# Patient Record
Sex: Male | Born: 2005 | Race: Black or African American | Hispanic: No | Marital: Single | Smoking: Never smoker
Health system: Southern US, Community
[De-identification: ages and names within clinical notes are randomized; demographics above are authoritative.]

---
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
## 2023-03-07 ENCOUNTER — Encounter: Payer: Self-pay | Admitting: *Deleted

## 2023-03-07 ENCOUNTER — Other Ambulatory Visit: Payer: Self-pay

## 2023-03-07 ENCOUNTER — Ambulatory Visit
Admission: EM | Admit: 2023-03-07 | Discharge: 2023-03-07 | Disposition: A | Discharge status: Home / Self Care | Payer: Medicaid Other | Attending: Family Medicine | Admitting: Family Medicine

## 2023-03-07 DIAGNOSIS — R369 Urethral discharge, unspecified: Secondary | ICD-10-CM | POA: Insufficient documentation

## 2023-03-07 DIAGNOSIS — Z711 Person with feared health complaint in whom no diagnosis is made: Secondary | ICD-10-CM | POA: Insufficient documentation

## 2023-03-07 NOTE — ED Triage Notes (Signed)
 Penile discharge x 3 days- requests testing for STIs

## 2023-03-07 NOTE — ED Provider Notes (Addendum)
 [name]    CSN: [id number] Arrival date & time: 03/07/23  1550      History   Chief Complaint Chief Complaint  Patient presents with   Penile Discharge    HPI Ernest Harris is a 18 y.o. male.  Patient here today requesting to STD. Patient initially reports abnormal penile discharge to nursing but denies any active symptoms or known STD exposures to this Clinical research associate. Patient would like HIV and RPR testing.   History reviewed. No pertinent past medical history.  There are no active problems to display for this patient.   History reviewed. No pertinent surgical history.     Home Medications    Prior to Admission medications   Medication Sig Start Date End Date Taking? Authorizing Provider  aspirin-acetaminophen-caffeine (EXCEDRIN MIGRAINE) [phone] MG tablet Take 1 tablet by mouth every 6 (six) hours as needed for migraine.   Yes [provider]  bacitracin ointment Apply 1 application. topically 2 (two) times daily. 03/17/21   Niel Hummer, MD  cetirizine (ZYRTEC ALLERGY) 10 MG tablet Take 1 tablet (10 mg total) by mouth daily. Patient not taking: Reported on 03/17/2021 09/29/19   Hall-Potvin, [place], PA-C  fluticasone (FLONASE) 50 MCG/ACT nasal spray Place 2 sprays into both nostrils daily. Patient not taking: Reported on 03/17/2021 11/12/18   Belinda Fisher, PA-C  HYDROcodone-acetaminophen (NORCO/VICODIN) 5-325 MG tablet Take 1 tablet by mouth every 4 (four) hours as needed. Patient not taking: Reported on 03/07/2023 03/17/21   Niel Hummer, MD    Family History History reviewed. No pertinent family history.  Social History Social History   Tobacco Use   Smoking status: Never    Passive exposure: Never  Vaping Use   Vaping status: Never Used  Substance Use Topics   Alcohol use: No   Drug use: Yes    Types: Marijuana     Allergies   Patient has no known allergies.   Review of Systems Review of Systems  Genitourinary:  Positive for  penile discharge.   Physical Exam Triage Vital Signs ED Triage Vitals  Encounter Vitals Group     BP 03/07/23 1608 124/81     Systolic BP Percentile --      Diastolic BP Percentile --      Pulse Rate 03/07/23 1608 64     Resp 03/07/23 1608 16     Temp 03/07/23 1608 98.4 F (36.9 C)     Temp Source 03/07/23 1608 Oral     SpO2 03/07/23 1608 98 %     Weight --      Height --      Head Circumference --      Peak Flow --      Pain Score 03/07/23 1605 0     Pain Loc --      Pain Education --      Exclude from Growth Chart --    No data found.  Updated Vital Signs BP 124/81 (BP Location: Right Arm)   Pulse 64   Temp 98.4 F (36.9 C) (Oral)   Resp 16   SpO2 98%   Visual Acuity Right Eye Distance:   Left Eye Distance:   Bilateral Distance:    Right Eye Near:   Left Eye Near:    Bilateral Near:     Physical Exam Vitals reviewed.  Constitutional:      Appearance: Normal appearance.  HENT:     Head: Normocephalic and atraumatic.     Nose: Nose normal.  Eyes:     Extraocular Movements: Extraocular movements intact.     Pupils: Pupils are equal, round, and reactive to light.  Cardiovascular:     Rate and Rhythm: Normal rate and regular rhythm.  Pulmonary:     Effort: Pulmonary effort is normal.     Breath sounds: Normal breath sounds.  Musculoskeletal:        General: Normal range of motion.     Cervical back: Normal range of motion and neck supple.  Skin:    General: Skin is warm.  Neurological:     General: No focal deficit present.     Mental Status: He is alert.      UC Treatments / Results  Labs (all labs ordered are listed, but only abnormal results are displayed) Labs Reviewed  HIV ANTIBODY (ROUTINE TESTING W REFLEX)   Narrative:    Performed at:  [address] [facility] [address] [place], [place]  [id number] Lab Director: Jolene Schimke MD, Phone:  [phone]  RPR   Narrative:    Performed at:  [address]  [address],  [place], [place]  [id number] Lab Director: Jolene Schimke MD, Phone:  [phone]  CYTOLOGY, (ORAL, ANAL, URETHRAL) ANCILLARY ONLY    EKG   Radiology No results found.  Procedures Procedures (including critical care time)  Medications Ordered in UC Medications - No data to display  Initial Impression / Assessment and Plan / UC Course  I have reviewed the triage vital signs and the nursing notes.  Pertinent labs & imaging results that were available during my care of the patient were reviewed by me and considered in my medical decision making (see chart for details).    Concern for STD, cytology self collected. HIV and RPR pending. Patient declines Rocephin injection for treatment of suspected urethritis. Patient prefers to await lab results. Patient advised all results will be viewable via MyChart. Our office will reach our only if results are abnormal Final Clinical Impressions(s) / UC Diagnoses   Final diagnoses:  Concern about STD in male without diagnosis  Penile discharge     Discharge Instructions      Lab results will be available within 1-2 days. Our office will notify you if any your results are abnormal.  Our office to not call on the weekend however if you see any results that are concerning to you you can call us directly.     ED Prescriptions   None    PDMP not reviewed this encounter.   Bing Neighbors, NP 03/10/23 1944    Bing Neighbors, NP 03/10/23 [phone]

## 2023-03-07 NOTE — Discharge Instructions (Addendum)
 Lab results will be available within 1-2 days. Our office will notify you if any your results are abnormal.  Our office to not call on the weekend however if you see any results that are concerning to you you can call us directly.

## 2023-03-08 LAB — CYTOLOGY, (ORAL, ANAL, URETHRAL) ANCILLARY ONLY
Chlamydia: NEGATIVE
Comment: NEGATIVE
Comment: NEGATIVE
Comment: NORMAL
Neisseria Gonorrhea: NEGATIVE
Trichomonas: NEGATIVE

## 2023-03-09 LAB — RPR: RPR Ser Ql: NONREACTIVE

## 2023-03-09 LAB — HIV ANTIBODY (ROUTINE TESTING W REFLEX): HIV Screen 4th Generation wRfx: NONREACTIVE

## 2023-04-03 ENCOUNTER — Encounter: Payer: Self-pay | Admitting: Emergency Medicine

## 2023-04-03 ENCOUNTER — Ambulatory Visit
Admission: EM | Admit: 2023-04-03 | Discharge: 2023-04-03 | Disposition: A | Discharge status: Home / Self Care | Attending: Internal Medicine | Admitting: Internal Medicine

## 2023-04-03 DIAGNOSIS — Z113 Encounter for screening for infections with a predominantly sexual mode of transmission: Secondary | ICD-10-CM | POA: Insufficient documentation

## 2023-04-03 DIAGNOSIS — R369 Urethral discharge, unspecified: Secondary | ICD-10-CM | POA: Diagnosis not present

## 2023-04-03 DIAGNOSIS — R300 Dysuria: Secondary | ICD-10-CM | POA: Diagnosis not present

## 2023-04-03 DIAGNOSIS — N489 Disorder of penis, unspecified: Secondary | ICD-10-CM | POA: Insufficient documentation

## 2023-04-03 LAB — POCT URINALYSIS DIP (MANUAL ENTRY)
Blood, UA: NEGATIVE
Glucose, UA: NEGATIVE mg/dL
Leukocytes, UA: NEGATIVE
Nitrite, UA: NEGATIVE
Protein Ur, POC: 30 mg/dL — AB
Spec Grav, UA: >=1.03 — AB (ref 1.010–1.025)
Urobilinogen, UA: 0.2 U/dL
pH, UA: 6 (ref 5.0–8.0)

## 2023-04-03 LAB — HSV 1/2 PCR (SURFACE)
HSV-1 DNA: NOT DETECTED
HSV-2 DNA: NOT DETECTED

## 2023-04-03 LAB — CYTOLOGY, (ORAL, ANAL, URETHRAL) ANCILLARY ONLY
Chlamydia: NEGATIVE
Comment: NEGATIVE
Comment: NEGATIVE
Comment: NORMAL
Neisseria Gonorrhea: NEGATIVE
Trichomonas: NEGATIVE

## 2023-04-03 NOTE — Discharge Instructions (Signed)
STD testing is pending.  Will call if anything is positive.

## 2023-04-03 NOTE — ED Triage Notes (Signed)
 Pt presents with penile discharge x 3 days. Last sexual encounter last week. Pt states he is having discharge and burn a little when urinating.   Pt is requesting blood testing.

## 2023-04-03 NOTE — ED Provider Notes (Addendum)
 [name]    CSN: [id number] Arrival date & time: 04/03/23  0859      History   Chief Complaint Chief Complaint  Patient presents with   Exposure to STD    HPI Ernest Harris is a 18 y.o. male.   Patient presents today for STD testing.  Reports penile discharge and mild urinary discomfort.  Symptoms started about 3 days ago.  He is also reporting some bumps on his penile shaft but he is not sure when this started.  Denies any exposure to STD.   Exposure to STD    History reviewed. No pertinent past medical history.  There are no active problems to display for this patient.   History reviewed. No pertinent surgical history.     Home Medications    Prior to Admission medications   Medication Sig Start Date End Date Taking? Authorizing Provider  aspirin-acetaminophen-caffeine (EXCEDRIN MIGRAINE) [id number] MG tablet Take 1 tablet by mouth every 6 (six) hours as needed for migraine.    [provider]  bacitracin ointment Apply 1 application. topically 2 (two) times daily. 03/17/21   Niel Hummer, MD  cetirizine (ZYRTEC ALLERGY) 10 MG tablet Take 1 tablet (10 mg total) by mouth daily. Patient not taking: Reported on 03/17/2021 09/29/19   Hall-Potvin, [place], PA-C  fluticasone (FLONASE) 50 MCG/ACT nasal spray Place 2 sprays into both nostrils daily. Patient not taking: Reported on 03/17/2021 11/12/18   Belinda Fisher, PA-C  HYDROcodone-acetaminophen (NORCO/VICODIN) 5-325 MG tablet Take 1 tablet by mouth every 4 (four) hours as needed. Patient not taking: Reported on 03/07/2023 03/17/21   Niel Hummer, MD    Family History History reviewed. No pertinent family history.  Social History Social History   Tobacco Use   Smoking status: Never    Passive exposure: Never  Vaping Use   Vaping status: Never Used  Substance Use Topics   Alcohol use: No   Drug use: Yes    Types: Marijuana     Allergies   Patient has no known allergies.   Review of  Systems Review of Systems Per HPI  Physical Exam Triage Vital Signs ED Triage Vitals  Encounter Vitals Group     BP 04/03/23 0949 (!) 142/81     Systolic BP Percentile --      Diastolic BP Percentile --      Pulse Rate 04/03/23 0949 (!) 53     Resp 04/03/23 0949 16     Temp 04/03/23 0949 98 F (36.7 C)     Temp Source 04/03/23 0949 Oral     SpO2 04/03/23 0949 97 %     Weight 04/03/23 0948 147 lb 14.4 oz (67.1 kg)     Height --      Head Circumference --      Peak Flow --      Pain Score 04/03/23 0948 0     Pain Loc --      Pain Education --      Exclude from Growth Chart --    No data found.  Updated Vital Signs BP (!) 142/81 (BP Location: Left Arm)   Pulse (!) 53   Temp 98 F (36.7 C) (Oral)   Resp 16   Wt 147 lb 14.4 oz (67.1 kg)   SpO2 97%   Visual Acuity Right Eye Distance:   Left Eye Distance:   Bilateral Distance:    Right Eye Near:   Left Eye Near:    Bilateral Near:  Physical Exam Exam conducted with a chaperone present.  Constitutional:      General: He is not in acute distress.    Appearance: Normal appearance. He is not toxic-appearing or diaphoretic.  HENT:     Head: Normocephalic and atraumatic.  Eyes:     Extraocular Movements: Extraocular movements intact.     Conjunctiva/sclera: Conjunctivae normal.  Pulmonary:     Effort: Pulmonary effort is normal.  Genitourinary:    Comments: Scattered, flesh-colored raised bumps that are pinpoint in size throughout penile shaft.  No drainage noted.  Self penile swab performed. Neurological:     General: No focal deficit present.     Mental Status: He is alert and oriented to person, place, and time. Mental status is at baseline.  Psychiatric:        Mood and Affect: Mood normal.        Behavior: Behavior normal.        Thought Content: Thought content normal.        Judgment: Judgment normal.      UC Treatments / Results  Labs (all labs ordered are listed, but only abnormal results are  displayed) Labs Reviewed  POCT URINALYSIS DIP (MANUAL ENTRY) - Abnormal; Notable for the following components:      Result Value   Bilirubin, UA small (*)    Ketones, POC UA trace (5) (*)    Spec Grav, UA >=1.030 (*)    Protein Ur, POC =30 (*)    All other components within normal limits  HSV 1/2 PCR (SURFACE)  CYTOLOGY, (ORAL, ANAL, URETHRAL) ANCILLARY ONLY    EKG   Radiology No results found.  Procedures Procedures (including critical care time)  Medications Ordered in UC Medications - No data to display  Initial Impression / Assessment and Plan / UC Course  I have reviewed the triage vital signs and the nursing notes.  Pertinent labs & imaging results that were available during my care of the patient were reviewed by me and considered in my medical decision making (see chart for details).     Cytology swab pending.  UA unremarkable.  HSV swab pending for penile lesions.  Low concern for HSV or genital warts at this time.  Could be [place] spots.  Awaiting results prior to any treatment given no exposure to STD. Patient declined blood work for HIV and syphilis.  Advised strict follow-up precautions.  Patient verbalized understanding and was agreeable with plan. Final Clinical Impressions(s) / UC Diagnoses   Final diagnoses:  Penile discharge  Dysuria  Screening examination for venereal disease  Penile lesion     Discharge Instructions      STD testing is pending.  Will call if anything is positive.    ED Prescriptions   None    PDMP not reviewed this encounter.   Gustavus Bryant, [place] 04/03/23 1137    Gustavus Bryant, [place] 04/03/23 1137
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
## 2023-08-27 ENCOUNTER — Encounter: Payer: Self-pay | Admitting: Emergency Medicine

## 2023-08-27 ENCOUNTER — Ambulatory Visit
Admission: EM | Admit: 2023-08-27 | Discharge: 2023-08-27 | Disposition: A | Discharge status: Home / Self Care | Attending: Physician Assistant | Admitting: Physician Assistant

## 2023-08-27 DIAGNOSIS — J029 Acute pharyngitis, unspecified: Secondary | ICD-10-CM | POA: Insufficient documentation

## 2023-08-27 DIAGNOSIS — J069 Acute upper respiratory infection, unspecified: Secondary | ICD-10-CM | POA: Insufficient documentation

## 2023-08-27 LAB — POCT RAPID STREP A (OFFICE): Rapid Strep A Screen: NEGATIVE

## 2023-08-27 LAB — POC SOFIA SARS ANTIGEN FIA: SARS Coronavirus 2 Ag: NEGATIVE

## 2023-08-27 MED ORDER — AMOXICILLIN 500 MG PO CAPS: 500.0000 mg | ORAL_CAPSULE | Freq: Two times a day (BID) | ORAL | 0 refills | Status: AC

## 2023-08-27 NOTE — ED Triage Notes (Signed)
 Pt presents c/o sore throat, sneeze, and cough x 4 days. Pt reports he has not tried any OTC meds yet. Pt denies fever and emesis.

## 2023-08-29 ENCOUNTER — Encounter (HOSPITAL_COMMUNITY): Payer: Self-pay

## 2023-08-29 ENCOUNTER — Ambulatory Visit (HOSPITAL_COMMUNITY)
Admission: EM | Admit: 2023-08-29 | Discharge: 2023-08-29 | Disposition: A | Discharge status: Home / Self Care | Attending: Internal Medicine | Admitting: Internal Medicine

## 2023-08-29 ENCOUNTER — Encounter: Payer: Self-pay | Admitting: Physician Assistant

## 2023-08-29 DIAGNOSIS — R509 Fever, unspecified: Secondary | ICD-10-CM

## 2023-08-29 DIAGNOSIS — J029 Acute pharyngitis, unspecified: Secondary | ICD-10-CM

## 2023-08-29 DIAGNOSIS — J039 Acute tonsillitis, unspecified: Secondary | ICD-10-CM

## 2023-08-29 LAB — CULTURE, GROUP A STREP (THRC)

## 2023-08-29 LAB — POCT RAPID STREP A (OFFICE): Rapid Strep A Screen: NEGATIVE

## 2023-08-29 LAB — POCT MONO SCREEN (KUC): Mono, POC: NEGATIVE

## 2023-08-29 MED ORDER — IBUPROFEN 800 MG PO TABS: 800.0000 mg | ORAL_TABLET | Freq: Three times a day (TID) | ORAL | 0 refills | Status: AC

## 2023-08-29 MED ORDER — IBUPROFEN 800 MG PO TABS
800.0000 mg | ORAL_TABLET | Freq: Once | ORAL | Status: AC
  Administered 2023-08-29: 800 mg via ORAL

## 2023-08-29 MED ORDER — IBUPROFEN 800 MG PO TABS
ORAL_TABLET | ORAL | Status: AC
  Filled 2023-08-29: qty 1

## 2023-08-29 NOTE — ED Triage Notes (Signed)
 Chief Complaint: sore throat, chills. Heat flashes, and headache.   Sick exposure: Yes- Works at [employer], sick people at work, unknown diagnosis.   Onset: 6 days   Prescriptions or OTC medications tried: Yes- throat spray, Excedrin    with little relief  New foods, medications, or products: No  Recent Travel: No

## 2023-08-29 NOTE — ED Provider Notes (Signed)
 [name]    CSN: [id number] Arrival date & time: 08/27/23  0854      History   Chief Complaint Chief Complaint  Patient presents with   Sore Throat    HPI Ernest Harris is a 18 y.o. male.   Patient here today for evaluation of sore throat, sneezing and cough that started 4 days ago.  He has not tried anything over-the-counter.  He denies any fever or vomiting.  The history is provided by the patient.  Sore Throat Pertinent negatives include no abdominal pain and no shortness of breath.    History reviewed. No pertinent past medical history.  There are no active problems to display for this patient.   History reviewed. No pertinent surgical history.     Home Medications    Prior to Admission medications   Medication Sig Start Date End Date Taking? Authorizing Provider  amoxicillin  (AMOXIL ) 500 MG capsule Take 1 capsule (500 mg total) by mouth 2 (two) times daily for 10 days. 08/27/23 09/06/23 Yes Billy Asberry FALCON, PA-C  aspirin-acetaminophen -caffeine (EXCEDRIN MIGRAINE) 250-250-65 MG tablet Take 1 tablet by mouth every 6 (six) hours as needed for migraine.    [provider]  bacitracin  ointment Apply 1 application. topically 2 (two) times daily. 03/17/21   Ettie Gull, MD  cetirizine  (ZYRTEC  ALLERGY) 10 MG tablet Take 1 tablet (10 mg total) by mouth daily. Patient not taking: Reported on 03/17/2021 09/29/19   Hall-Potvin, [place], PA-C  fluticasone  (FLONASE ) 50 MCG/ACT nasal spray Place 2 sprays into both nostrils daily. Patient not taking: Reported on 03/17/2021 11/12/18   Babara Greig GAILS, PA-C  HYDROcodone -acetaminophen  (NORCO/VICODIN) 5-325 MG tablet Take 1 tablet by mouth every 4 (four) hours as needed. Patient not taking: Reported on 03/07/2023 03/17/21   Ettie Gull, MD  ibuprofen  (ADVIL ) 800 MG tablet Take 1 tablet (800 mg total) by mouth 3 (three) times daily. 08/29/23   Enedelia Dorna HERO, FNP    Family History History reviewed. No  pertinent family history.  Social History Social History   Tobacco Use   Smoking status: Never    Passive exposure: Never   Smokeless tobacco: Never  Vaping Use   Vaping status: Never Used  Substance Use Topics   Alcohol use: No   Drug use: Yes    Types: Marijuana     Allergies   Patient has no known allergies.   Review of Systems Review of Systems  Constitutional:  Negative for chills and fever.  HENT:  Positive for congestion, sneezing and sore throat. Negative for ear pain.   Eyes:  Negative for discharge and redness.  Respiratory:  Positive for cough. Negative for shortness of breath.   Gastrointestinal:  Negative for abdominal pain, nausea and vomiting.     Physical Exam Triage Vital Signs ED Triage Vitals  Encounter Vitals Group     BP 08/27/23 1042 122/72     Girls Systolic BP Percentile --      Girls Diastolic BP Percentile --      Boys Systolic BP Percentile --      Boys Diastolic BP Percentile --      Pulse Rate 08/27/23 1042 79     Resp 08/27/23 1042 16     Temp 08/27/23 1042 98.3 F (36.8 C)     Temp Source 08/27/23 1042 Oral     SpO2 08/27/23 1042 98 %     Weight 08/27/23 1041 147 lb 14.9 oz (67.1 kg)     Height --  Head Circumference --      Peak Flow --      Pain Score 08/27/23 1040 6     Pain Loc --      Pain Education --      Exclude from Growth Chart --    No data found.  Updated Vital Signs BP 122/72 (BP Location: Right Arm)   Pulse 79   Temp 98.3 F (36.8 C) (Oral)   Resp 16   Wt 147 lb 14.9 oz (67.1 kg)   SpO2 98%   Visual Acuity Right Eye Distance:   Left Eye Distance:   Bilateral Distance:    Right Eye Near:   Left Eye Near:    Bilateral Near:     Physical Exam Vitals and nursing note reviewed.  Constitutional:      General: He is not in acute distress.    Appearance: Normal appearance. He is not ill-appearing.  HENT:     Head: Normocephalic and atraumatic.     Nose: Nose normal. No congestion.      Mouth/Throat:     Mouth: Mucous membranes are moist.     Pharynx: Oropharynx is clear. Posterior oropharyngeal erythema present. No oropharyngeal exudate.  Eyes:     Conjunctiva/sclera: Conjunctivae normal.  Cardiovascular:     Rate and Rhythm: Normal rate and regular rhythm.     Heart sounds: Normal heart sounds. No murmur heard. Pulmonary:     Effort: Pulmonary effort is normal. No respiratory distress.     Breath sounds: Normal breath sounds. No wheezing, rhonchi or rales.  Skin:    General: Skin is warm and dry.  Neurological:     Mental Status: He is alert.  Psychiatric:        Mood and Affect: Mood normal.        Thought Content: Thought content normal.      UC Treatments / Results  Labs (all labs ordered are listed, but only abnormal results are displayed) Labs Reviewed  POCT RAPID STREP A (OFFICE) - Normal  CULTURE, GROUP A STREP ([facility])  POC SOFIA SARS ANTIGEN FIA    EKG   Radiology No results found.  Procedures Procedures (including critical care time)  Medications Ordered in UC Medications - No data to display  Initial Impression / Assessment and Plan / UC Course  I have reviewed the triage vital signs and the nursing notes.  Pertinent labs & imaging results that were available during my care of the patient were reviewed by me and considered in my medical decision making (see chart for details).    Rapid strep screen and COVID screening negative.  Given significant pharyngitis will order amoxicillin  and culture ordered as well.  Will await results further recommendation.  Encouraged follow-up if no gradual improvement with any further concerns.  Final Clinical Impressions(s) / UC Diagnoses   Final diagnoses:  Acute upper respiratory infection  Acute pharyngitis, unspecified etiology   Discharge Instructions   None    ED Prescriptions     Medication Sig Dispense Auth. Provider   amoxicillin  (AMOXIL ) 500 MG capsule Take 1 capsule (500 mg total)  by mouth 2 (two) times daily for 10 days. 20 capsule Billy Asberry FALCON, PA-C      PDMP not reviewed this encounter.   Billy Asberry FALCON, PA-C 08/29/23 [id number]

## 2023-08-29 NOTE — ED Provider Notes (Addendum)
 [facility]    CSN: [id number] Arrival date & time: 08/29/23  1031      History   Chief Complaint Chief Complaint  Patient presents with   Sore Throat   Headache    HPI Ernest Harris is a 18 y.o. male.   Ernest Harris is a 18 y.o. male presenting for chief complaint of Sore Throat and Headache that started 6 days ago.  He has had nasal congestion and minimal coughing as well.  Sore throat is worsened by swallowing and talking.  States his tonsils feel swollen.  He has had a fever at home for the last several days and reports chills.  He is unsure of max temp at home.  Denies nausea, vomiting, diarrhea, abdominal pain, rash, and recent sick contacts with similar symptoms.  He went to urgent care 2 days ago to be seen for symptoms where he tested negative for strep and COVID.  Throat culture is still pending.  Mono testing was not performed.  He was prescribed empiric treatment for strep throat with amoxicillin  but he has not yet picked up this medication from the pharmacy and has not started taking it.  Taking Excedrin for generalized headache and sore throat with minimal relief.  Currently febrile at 100.6.  Last dose of Excedrin was yesterday.   Sore Throat Associated symptoms include headaches.  Headache   History reviewed. No pertinent past medical history.  There are no active problems to display for this patient.   History reviewed. No pertinent surgical history.     Home Medications    Prior to Admission medications   Medication Sig Start Date End Date Taking? Authorizing Provider  aspirin-acetaminophen -caffeine (EXCEDRIN MIGRAINE) 250-250-65 MG tablet Take 1 tablet by mouth every 6 (six) hours as needed for migraine.   Yes [provider]  ibuprofen  (ADVIL ) 800 MG tablet Take 1 tablet (800 mg total) by mouth 3 (three) times daily. 08/29/23  Yes Enedelia Dorna HERO, FNP  amoxicillin  (AMOXIL ) 500 MG capsule Take 1 capsule (500 mg total) by  mouth 2 (two) times daily for 10 days. 08/27/23 09/06/23  Billy Asberry FALCON, PA-C  bacitracin  ointment Apply 1 application. topically 2 (two) times daily. 03/17/21   Ettie Gull, MD  cetirizine  (ZYRTEC  ALLERGY) 10 MG tablet Take 1 tablet (10 mg total) by mouth daily. Patient not taking: Reported on 03/17/2021 09/29/19   Hall-Potvin, [place], PA-C  fluticasone  (FLONASE ) 50 MCG/ACT nasal spray Place 2 sprays into both nostrils daily. Patient not taking: Reported on 03/17/2021 11/12/18   Babara Greig GAILS, PA-C  HYDROcodone -acetaminophen  (NORCO/VICODIN) 5-325 MG tablet Take 1 tablet by mouth every 4 (four) hours as needed. Patient not taking: Reported on 03/07/2023 03/17/21   Ettie Gull, MD    Family History History reviewed. No pertinent family history.  Social History Social History   Tobacco Use   Smoking status: Never    Passive exposure: Never   Smokeless tobacco: Never  Vaping Use   Vaping status: Never Used  Substance Use Topics   Alcohol use: No   Drug use: Yes    Types: Marijuana     Allergies   Patient has no known allergies.   Review of Systems Review of Systems  Neurological:  Positive for headaches.  Per HPI   Physical Exam Triage Vital Signs ED Triage Vitals  Encounter Vitals Group     BP 08/29/23 1140 126/78     Girls Systolic BP Percentile --      Girls Diastolic BP  Percentile --      Boys Systolic BP Percentile --      Boys Diastolic BP Percentile --      Pulse Rate 08/29/23 1140 91     Resp 08/29/23 1140 18     Temp 08/29/23 1140 99.5 F (37.5 C)     Temp Source 08/29/23 1140 Oral     SpO2 08/29/23 1140 97 %     Weight 08/29/23 1140 150 lb (68 kg)     Height 08/29/23 1140 6' (1.829 m)     Head Circumference --      Peak Flow --      Pain Score 08/29/23 1139 3     Pain Loc --      Pain Education --      Exclude from Growth Chart --    No data found.  Updated Vital Signs BP 126/78 (BP Location: Left Arm)   Pulse 91   Temp 99.5 F (37.5 C) (Oral)    Resp 18   Ht 6' (1.829 m)   Wt 150 lb (68 kg)   SpO2 97%   BMI 20.34 kg/m   Visual Acuity Right Eye Distance:   Left Eye Distance:   Bilateral Distance:    Right Eye Near:   Left Eye Near:    Bilateral Near:     Physical Exam Vitals and nursing note reviewed.  Constitutional:      Appearance: He is not ill-appearing or toxic-appearing.  HENT:     Head: Normocephalic and atraumatic.     Right Ear: Hearing, tympanic membrane, ear canal and external ear normal.     Left Ear: Hearing, tympanic membrane, ear canal and external ear normal.     Nose: Nose normal.     Mouth/Throat:     Lips: Pink.     Mouth: Mucous membranes are moist. No injury or oral lesions.     Dentition: Normal dentition.     Tongue: No lesions.     Pharynx: Uvula midline. Pharyngeal swelling and posterior oropharyngeal erythema present. No oropharyngeal exudate, uvula swelling or postnasal drip.     Tonsils: Tonsillar exudate (Patchy white exudate to bilateral tonsils) present. No tonsillar abscesses. 3+ on the right. 3+ on the left.     Comments: No trismus, phonation normal, maintaining secretions without difficulty.  Eyes:     General: Lids are normal. Vision grossly intact. Gaze aligned appropriately.     Extraocular Movements: Extraocular movements intact.     Conjunctiva/sclera: Conjunctivae normal.  Neck:     Trachea: Trachea and phonation normal.  Cardiovascular:     Rate and Rhythm: Normal rate and regular rhythm.     Heart sounds: Normal heart sounds, S1 normal and S2 normal.  Pulmonary:     Effort: Pulmonary effort is normal. No respiratory distress.     Breath sounds: Normal breath sounds and air entry.  Musculoskeletal:     Cervical back: Neck supple.  Lymphadenopathy:     Cervical: Cervical adenopathy present.  Skin:    General: Skin is warm and dry.     Capillary Refill: Capillary refill takes less than 2 seconds.     Findings: No rash.  Neurological:     General: No focal deficit  present.     Mental Status: He is alert and oriented to person, place, and time. Mental status is at baseline.     Cranial Nerves: No dysarthria or facial asymmetry.  Psychiatric:        Mood  and Affect: Mood normal.        Speech: Speech normal.        Behavior: Behavior normal.        Thought Content: Thought content normal.        Judgment: Judgment normal.      UC Treatments / Results  Labs (all labs ordered are listed, but only abnormal results are displayed) Labs Reviewed  POCT RAPID STREP A (OFFICE)  POCT MONO SCREEN ([facility])    EKG   Radiology No results found.  Procedures Procedures (including critical care time)  Medications Ordered in UC Medications  ibuprofen  (ADVIL ) tablet 800 mg (800 mg Oral Given 08/29/23 1223)    Initial Impression / Assessment and Plan / UC Course  I have reviewed the triage vital signs and the nursing notes.  Pertinent labs & imaging results that were available during my care of the patient were reviewed by me and considered in my medical decision making (see chart for details).   1.  Acute tonsillitis, sore throat, fever Previous provider note was unavailable for review from visit on August 27, 2023. Amoxicillin  had been sent to the pharmacy, however patient had not picked this medication up yet and has not been taking any recent antibiotics.  Rapid strep testing is negative, throat culture pending. Monospot testing is negative.  Given clinical presentation, Centor score (3), and concern for bacterial pharyngitis, advised patient to pick up amoxicillin  that had been sent to the pharmacy 2 days ago and take this every 12 hours for 10 days.  No red flags on exam today. Ibuprofen  800 mg every 8 hours as needed for pain.  Counseled patient on potential for adverse effects with medications prescribed/recommended today, strict ER and return-to-clinic precautions discussed, patient verbalized understanding.    Final Clinical  Impressions(s) / UC Diagnoses   Final diagnoses:  Sore throat  Acute tonsillitis, unspecified etiology  Fever, unspecified     Discharge Instructions      Your strep testing here was negative.  Throat culture is pending. Mono testing is negative. Please pick up the antibiotic sent in by the PA that you saw yesterday at [facility] and take this every 12 hours for 10 days. I am concerned that you may have a bacterial infection to the back of your throat given your tonsil swelling and persistent fevers. Take ibuprofen  800mg  every 8 hours as needed for throat pain.  If you develop any new or worsening symptoms or if your symptoms do not start to improve, please return here or follow-up with your primary care provider. If your symptoms are severe, please go to the emergency room.     ED Prescriptions     Medication Sig Dispense Auth. Provider   ibuprofen  (ADVIL ) 800 MG tablet Take 1 tablet (800 mg total) by mouth 3 (three) times daily. 21 tablet Enedelia Dorna HERO, FNP      PDMP not reviewed this encounter.   Enedelia Dorna HERO, FNP 08/29/23 1323    Enedelia Dorna HERO, FNP 08/29/23 1324

## 2023-08-29 NOTE — Discharge Instructions (Addendum)
 Your strep testing here was negative.  Throat culture is pending. Mono testing is negative. Please pick up the antibiotic sent in by the PA that you saw yesterday at [facility] and take this every 12 hours for 10 days. I am concerned that you may have a bacterial infection to the back of your throat given your tonsil swelling and persistent fevers. Take ibuprofen  800mg  every 8 hours as needed for throat pain.  If you develop any new or worsening symptoms or if your symptoms do not start to improve, please return here or follow-up with your primary care provider. If your symptoms are severe, please go to the emergency room.

## 2023-09-01 ENCOUNTER — Other Ambulatory Visit: Payer: Self-pay

## 2023-09-01 ENCOUNTER — Encounter (HOSPITAL_COMMUNITY): Payer: Self-pay

## 2023-09-01 ENCOUNTER — Emergency Department (HOSPITAL_COMMUNITY)
Admission: EM | Admit: 2023-09-01 | Discharge: 2023-09-01 | Disposition: A | Discharge status: Home / Self Care | Attending: Emergency Medicine | Admitting: Emergency Medicine

## 2023-09-01 DIAGNOSIS — J039 Acute tonsillitis, unspecified: Secondary | ICD-10-CM | POA: Insufficient documentation

## 2023-09-01 DIAGNOSIS — Z7982 Long term (current) use of aspirin: Secondary | ICD-10-CM | POA: Insufficient documentation

## 2023-09-01 DIAGNOSIS — J029 Acute pharyngitis, unspecified: Secondary | ICD-10-CM | POA: Diagnosis present

## 2023-09-01 LAB — CBC WITH DIFFERENTIAL/PLATELET
Abs Immature Granulocytes: 0.07 K/uL (ref 0.00–0.07)
Basophils Absolute: 0 K/uL (ref 0.0–0.1)
Basophils Relative: 0 %
Eosinophils Absolute: 0 K/uL (ref 0.0–0.5)
Eosinophils Relative: 0 %
HCT: 40.6 % (ref 39.0–52.0)
Hemoglobin: 14 g/dL (ref 13.0–17.0)
Immature Granulocytes: 1 %
Lymphocytes Relative: 7 %
Lymphs Abs: 0.8 K/uL (ref 0.7–4.0)
MCH: 30.4 pg (ref 26.0–34.0)
MCHC: 34.5 g/dL (ref 30.0–36.0)
MCV: 88.1 fL (ref 80.0–100.0)
Monocytes Absolute: 1.6 K/uL — ABNORMAL HIGH (ref 0.1–1.0)
Monocytes Relative: 13 %
Neutro Abs: 9.6 K/uL — ABNORMAL HIGH (ref 1.7–7.7)
Neutrophils Relative %: 79 %
Platelets: 358 K/uL (ref 150–400)
RBC: 4.61 MIL/uL (ref 4.22–5.81)
RDW: 12.5 % (ref 11.5–15.5)
WBC: 12.2 K/uL — ABNORMAL HIGH (ref 4.0–10.5)
nRBC: 0 % (ref 0.0–0.2)

## 2023-09-01 LAB — BASIC METABOLIC PANEL WITH GFR
Anion gap: 14 (ref 5–15)
BUN: 10 mg/dL (ref 6–20)
CO2: 21 mmol/L — ABNORMAL LOW (ref 22–32)
Calcium: 9.3 mg/dL (ref 8.9–10.3)
Chloride: 102 mmol/L (ref 98–111)
Creatinine, Ser: 0.99 mg/dL (ref 0.61–1.24)
GFR, Estimated: >60 mL/min (ref >60)
Glucose, Bld: 119 mg/dL — ABNORMAL HIGH (ref 70–99)
Potassium: 3.4 mmol/L — ABNORMAL LOW (ref 3.5–5.1)
Sodium: 137 mmol/L (ref 135–145)

## 2023-09-01 LAB — MONONUCLEOSIS SCREEN: Mono Screen: NEGATIVE

## 2023-09-01 LAB — HIV ANTIBODY (ROUTINE TESTING W REFLEX): HIV Screen 4th Generation wRfx: NONREACTIVE

## 2023-09-01 MED ORDER — ONDANSETRON 4 MG PO TBDP: 4.0000 mg | ORAL_TABLET | Freq: Three times a day (TID) | ORAL | 0 refills | Status: AC | PRN

## 2023-09-01 MED ORDER — DEXAMETHASONE SODIUM PHOSPHATE 10 MG/ML IJ SOLN
10.0000 mg | Freq: Once | INTRAMUSCULAR | Status: AC
  Administered 2023-09-01: 10 mg via INTRAVENOUS
  Filled 2023-09-01: qty 1

## 2023-09-01 MED ORDER — SODIUM CHLORIDE 0.9 % IV SOLN
1000.0000 mL | INTRAVENOUS | Status: DC
  Administered 2023-09-01: 1000 mL via INTRAVENOUS

## 2023-09-01 MED ORDER — IBUPROFEN 800 MG PO TABS
800.0000 mg | ORAL_TABLET | Freq: Once | ORAL | Status: AC
Start: 2023-09-01 — End: 2023-09-01
  Administered 2023-09-01: 800 mg via ORAL
  Filled 2023-09-01: qty 1

## 2023-09-01 MED ORDER — CLINDAMYCIN PHOSPHATE 600 MG/50ML IV SOLN
600.0000 mg | Freq: Once | INTRAVENOUS | Status: AC
  Administered 2023-09-01: 600 mg via INTRAVENOUS
  Filled 2023-09-01: qty 50

## 2023-09-01 MED ORDER — SODIUM CHLORIDE 0.9 % IV BOLUS (SEPSIS)
1000.0000 mL | Freq: Once | INTRAVENOUS | Status: AC
  Administered 2023-09-01: 1000 mL via INTRAVENOUS

## 2023-09-01 MED ORDER — CLINDAMYCIN HCL 300 MG PO CAPS: 300.0000 mg | ORAL_CAPSULE | Freq: Three times a day (TID) | ORAL | 0 refills | Status: AC

## 2023-09-01 MED ORDER — HYDROCODONE-ACETAMINOPHEN 5-325 MG PO TABS
1.0000 | ORAL_TABLET | Freq: Once | ORAL | Status: AC
  Administered 2023-09-01: 1 via ORAL
  Filled 2023-09-01: qty 1

## 2023-09-01 NOTE — Discharge Instructions (Signed)
 Return if any problems.

## 2023-09-01 NOTE — ED Triage Notes (Signed)
 Pt states he has been having a sore throat, cold chills, heat waves, headaches for the past 6 days. Went to UC and was given antibiotics and has taken 3 days of it, was given a prescription for 10 days. States he has not been able to eat.

## 2023-09-01 NOTE — ED Provider Notes (Signed)
 [place] EMERGENCY DEPARTMENT AT [facility] Provider Note   CSN: [id number] Arrival date & time: 09/01/23  1400     Patient presents with: Sore Throat   Ernest Harris is a 18 y.o. male.   Patient complains of a sore throat.  Patient was prescribed amoxicillin  4 days ago he began taking the medication 2 days ago.  Patient reports he has vomited after every dose of the antibiotic.  Patient reports swelling to his tonsils seems worse and he complains of discomfort to his neck.  Patient denies fever but complains of bodyaches.  Patient was seen at urgent care and had a negative strep and negative mono.  Patient denies any medical problems he is not allergic to any medications.  He is not having any difficulty swallowing.  He does have pain with swallowing  The history is provided by the patient. No language interpreter was used.  Sore Throat       Prior to Admission medications   Medication Sig Start Date End Date Taking? Authorizing Provider  clindamycin  (CLEOCIN ) 300 MG capsule Take 1 capsule (300 mg total) by mouth 3 (three) times daily for 10 days. 09/01/23 09/11/23 Yes Petrina Melby K, PA-C  ondansetron  (ZOFRAN -ODT) 4 MG disintegrating tablet Take 1 tablet (4 mg total) by mouth every 8 (eight) hours as needed for nausea or vomiting (take 30 minutes before antibiotic). 09/01/23  Yes Gianno Volner K, PA-C  amoxicillin  (AMOXIL ) 500 MG capsule Take 1 capsule (500 mg total) by mouth 2 (two) times daily for 10 days. 08/27/23 09/06/23  Billy Asberry FALCON, PA-C  aspirin-acetaminophen -caffeine (EXCEDRIN MIGRAINE) 250-250-65 MG tablet Take 1 tablet by mouth every 6 (six) hours as needed for migraine.    [provider]  bacitracin  ointment Apply 1 application. topically 2 (two) times daily. 03/17/21   Ettie Gull, MD  cetirizine  (ZYRTEC  ALLERGY) 10 MG tablet Take 1 tablet (10 mg total) by mouth daily. Patient not taking: Reported on 03/17/2021 09/29/19   Hall-Potvin, [place],  PA-C  fluticasone  (FLONASE ) 50 MCG/ACT nasal spray Place 2 sprays into both nostrils daily. Patient not taking: Reported on 03/17/2021 11/12/18   Babara Greig GAILS, PA-C  HYDROcodone -acetaminophen  (NORCO/VICODIN) 5-325 MG tablet Take 1 tablet by mouth every 4 (four) hours as needed. Patient not taking: Reported on 03/07/2023 03/17/21   Ettie Gull, MD  ibuprofen  (ADVIL ) 800 MG tablet Take 1 tablet (800 mg total) by mouth 3 (three) times daily. 08/29/23   Enedelia Dorna HERO, FNP    Allergies: Patient has no known allergies.    Review of Systems  All other systems reviewed and are negative.   Updated Vital Signs BP 125/72   Pulse 84   Temp 100 F (37.8 C) (Oral)   Resp 18   SpO2 99%   Physical Exam Vitals and nursing note reviewed.  Constitutional:      Appearance: He is well-developed.  HENT:     Head: Normocephalic.     Right Ear: Tympanic membrane normal.     Left Ear: Tympanic membrane normal.     Mouth/Throat:     Pharynx: Pharyngeal swelling and posterior oropharyngeal erythema present.     Tonsils: 2+ on the right. 2+ on the left.     Comments: No trismus, no palate edema uvula is midline, no sign of episodes. Cardiovascular:     Rate and Rhythm: Normal rate.  Pulmonary:     Effort: Pulmonary effort is normal.  Abdominal:     General: There is no  distension.  Musculoskeletal:        General: Normal range of motion.     Cervical back: Normal range of motion.  Neurological:     Mental Status: He is alert and oriented to person, place, and time.     (all labs ordered are listed, but only abnormal results are displayed) Labs Reviewed  CBC WITH DIFFERENTIAL/PLATELET - Abnormal; Notable for the following components:      Result Value   WBC 12.2 (*)    Neutro Abs 9.6 (*)    Monocytes Absolute 1.6 (*)    All other components within normal limits  BASIC METABOLIC PANEL WITH GFR - Abnormal; Notable for the following components:   Potassium 3.4 (*)    CO2 21 (*)     Glucose, Bld 119 (*)    All other components within normal limits  MONONUCLEOSIS SCREEN  HIV ANTIBODY (ROUTINE TESTING W REFLEX)  GC/CHLAMYDIA PROBE AMP ([employer]) NOT AT [facility]    EKG: None  Radiology: No results found.   Procedures   Medications Ordered in the ED  sodium chloride  0.9 % bolus 1,000 mL (0 mLs Intravenous Stopped 09/01/23 1704)    Followed by  0.9 %  sodium chloride  infusion (0 mLs Intravenous Stopped 09/01/23 1801)  clindamycin  (CLEOCIN ) IVPB 600 mg (0 mg Intravenous Stopped 09/01/23 1618)  dexamethasone  (DECADRON ) injection 10 mg (10 mg Intravenous Given 09/01/23 1517)  ibuprofen  (ADVIL ) tablet 800 mg (800 mg Oral Given 09/01/23 1707)  HYDROcodone -acetaminophen  (NORCO/VICODIN) 5-325 MG per tablet 1 tablet (1 tablet Oral Given 09/01/23 1707)                                    Medical Decision Making Patient complains of swollen tonsils and a sore throat.  Amount and/or Complexity of Data Reviewed Labs: ordered. Decision-making details documented in ED Course.    Details: Labs ordered reviewed and interpreted influenza COVID and RSV are negative Mono is negative strep is negative.  Risk Prescription drug management. Risk Details: Patient is given IV clindamycin  and Decadron .  Ibuprofen  and 1 hydrocodone  to help with pain.  Patient is reevaluated and he feels much better.  Patient is given a prescription for clindamycin .  He is given a prescription for Zofran  he is advised to take Zofran  1 tablet 30 minutes before taking the antibiotic.  Patient is advised to return to the emergency department if any problems.        Final diagnoses:  Tonsillitis    ED Discharge Orders          Ordered    ondansetron  (ZOFRAN -ODT) 4 MG disintegrating tablet  Every 8 hours PRN        09/01/23 1753    clindamycin  (CLEOCIN ) 300 MG capsule  3 times daily        09/01/23 1753            An After Visit Summary was printed and given to the patient.    Uchechi Denison  K, PA-C 09/01/23 1907    Towana Ozell BROCKS, MD 09/02/23 [phone]

## 2023-09-02 LAB — GC/CHLAMYDIA PROBE AMP (~~LOC~~) NOT AT ARMC
Chlamydia: NEGATIVE
Comment: NEGATIVE
Comment: NORMAL
Neisseria Gonorrhea: NEGATIVE
